# Patient Record
Sex: Female | Born: 1955 | Race: White | Hispanic: No | Marital: Married
Health system: Southern US, Community
[De-identification: ages and names within clinical notes are randomized; demographics above are authoritative.]

---

## 2005-04-07 ENCOUNTER — Ambulatory Visit (HOSPITAL_COMMUNITY): Admission: RE | Admit: 2005-04-07 | Discharge: 2005-04-07 | Payer: Self-pay | Admitting: Certified Nurse Midwife

## 2005-04-15 ENCOUNTER — Ambulatory Visit: Payer: Self-pay | Admitting: Internal Medicine

## 2005-04-21 LAB — COMPREHENSIVE METABOLIC PANEL
AST: 15 U/L (ref 0–37)
Albumin: 4.1 g/dL (ref 3.5–5.2)
Alkaline Phosphatase: 50 U/L (ref 39–117)
BUN: 9 mg/dL (ref 6–23)
Calcium: 8.7 mg/dL (ref 8.4–10.5)
Total Bilirubin: 0.3 mg/dL (ref 0.3–1.2)
Total Protein: 7 g/dL (ref 6.0–8.3)

## 2005-04-21 LAB — CBC WITH DIFFERENTIAL/PLATELET
Basophils Absolute: 0 10*3/uL (ref 0.0–0.1)
Eosinophils Absolute: 0 10*3/uL (ref 0.0–0.5)
LYMPH%: 28.9 % (ref 14.0–48.0)
MCHC: 31.2 g/dL — ABNORMAL LOW (ref 32.0–36.0)
MCV: 74.7 fL — ABNORMAL LOW (ref 81.0–101.0)
MONO%: 9.4 % (ref 0.0–13.0)
NEUT#: 2.4 10*3/uL (ref 1.5–6.5)
Platelets: 246 10*3/uL (ref 145–400)
RDW: 31.5 % — ABNORMAL HIGH (ref 11.3–14.5)
WBC: 4.1 10*3/uL (ref 3.9–10.0)

## 2005-04-22 LAB — PROTEIN ELECTROPHORESIS, SERUM
Alpha-1-Globulin: 4.9 % (ref 2.9–4.9)
Alpha-2-Globulin: 10.2 % (ref 7.1–11.8)
Beta 2: 4.7 % (ref 3.2–6.5)
Beta Globulin: 7.4 % — ABNORMAL HIGH (ref 4.7–7.2)
Gamma Globulin: 17.4 % (ref 11.1–18.8)

## 2005-04-22 LAB — FERRITIN: Ferritin: 20 ng/mL (ref 10–291)

## 2005-04-22 LAB — LACTATE DEHYDROGENASE: LDH: 149 U/L (ref 94–250)

## 2005-04-22 LAB — IRON AND TIBC: TIBC: 410 ug/dL (ref 250–470)

## 2005-06-01 ENCOUNTER — Ambulatory Visit (HOSPITAL_COMMUNITY): Admission: RE | Admit: 2005-06-01 | Discharge: 2005-06-02 | Payer: Self-pay | Admitting: Obstetrics and Gynecology

## 2005-06-01 ENCOUNTER — Encounter (INDEPENDENT_AMBULATORY_CARE_PROVIDER_SITE_OTHER): Payer: Self-pay | Admitting: *Deleted

## 2005-06-07 ENCOUNTER — Ambulatory Visit: Payer: Self-pay | Admitting: Internal Medicine

## 2006-10-10 ENCOUNTER — Ambulatory Visit (HOSPITAL_COMMUNITY): Admission: RE | Admit: 2006-10-10 | Discharge: 2006-10-10 | Payer: Self-pay | Admitting: Obstetrics and Gynecology

## 2007-07-08 IMAGING — CR DG CHEST 2V
2 series · 2 of 2 positions shown · non-contrast
Comparison: None.

CLINICAL DATA: Bronchitis.
 CHEST ? 2 VIEW:

[view not recorded (1 of 2)]
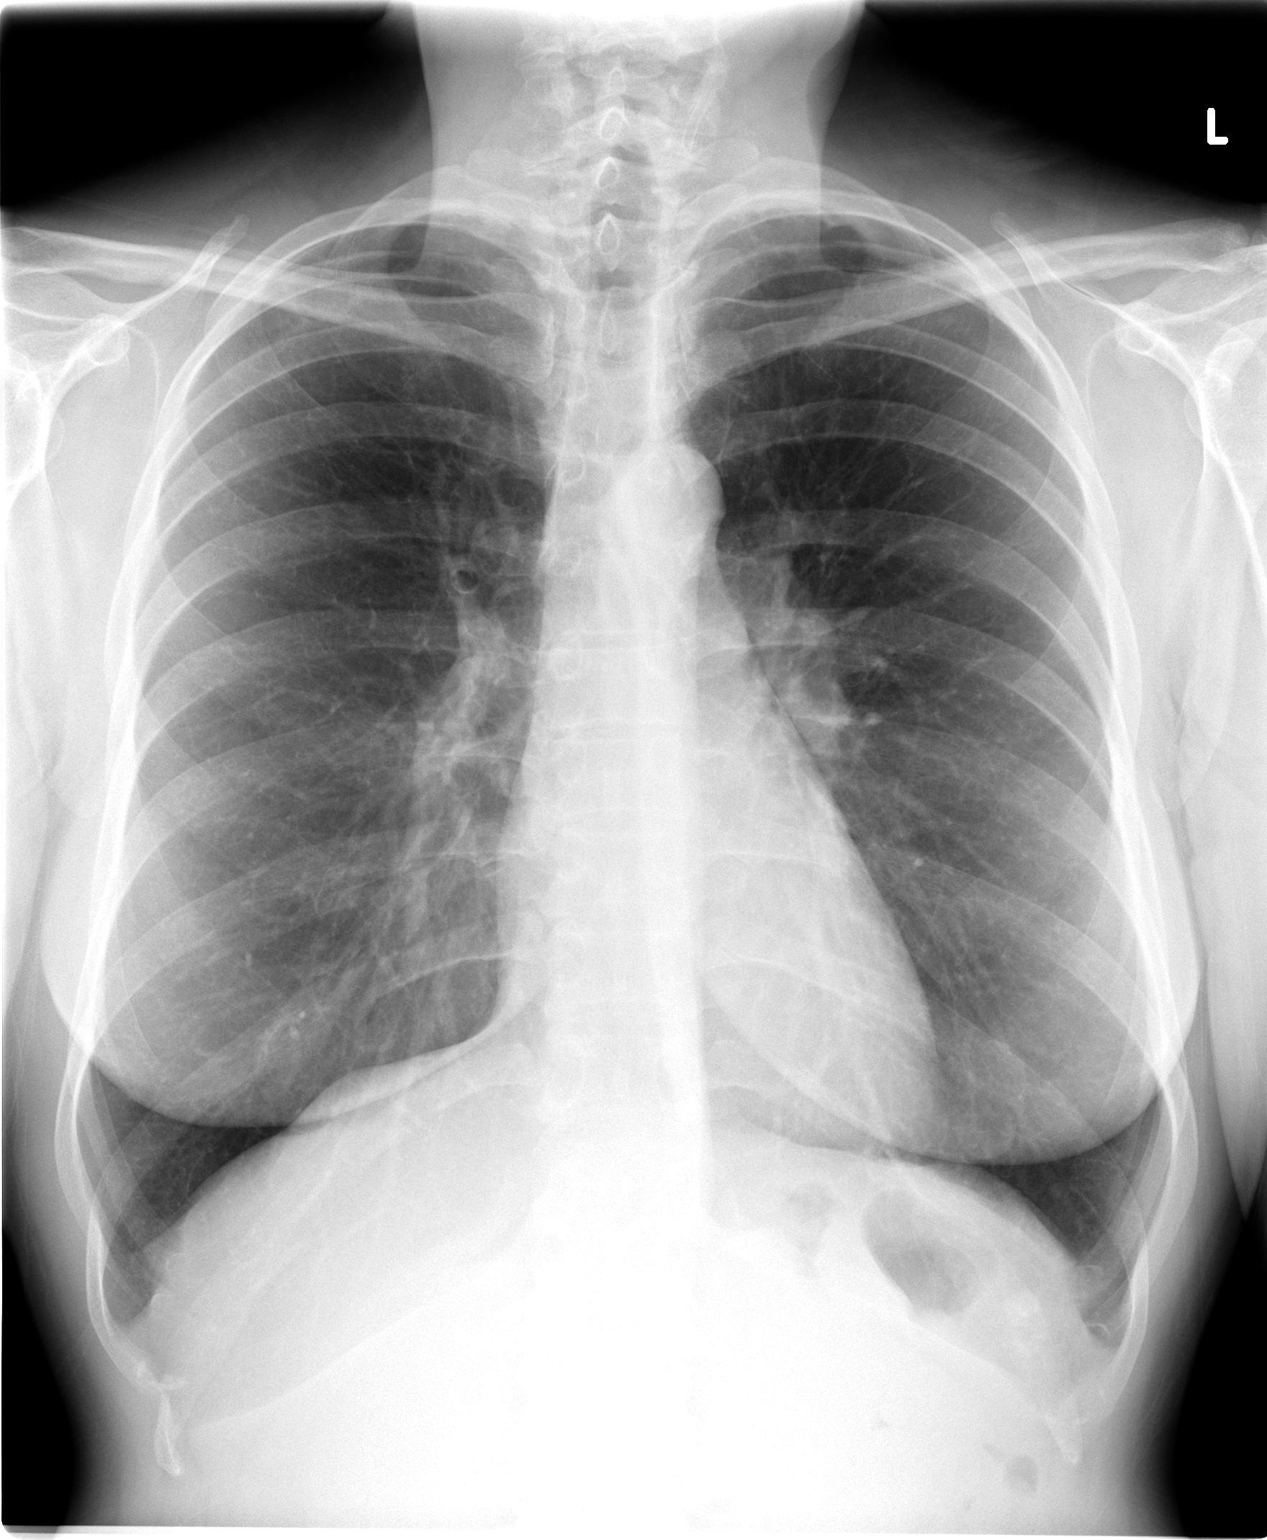

[view not recorded (2 of 2)]
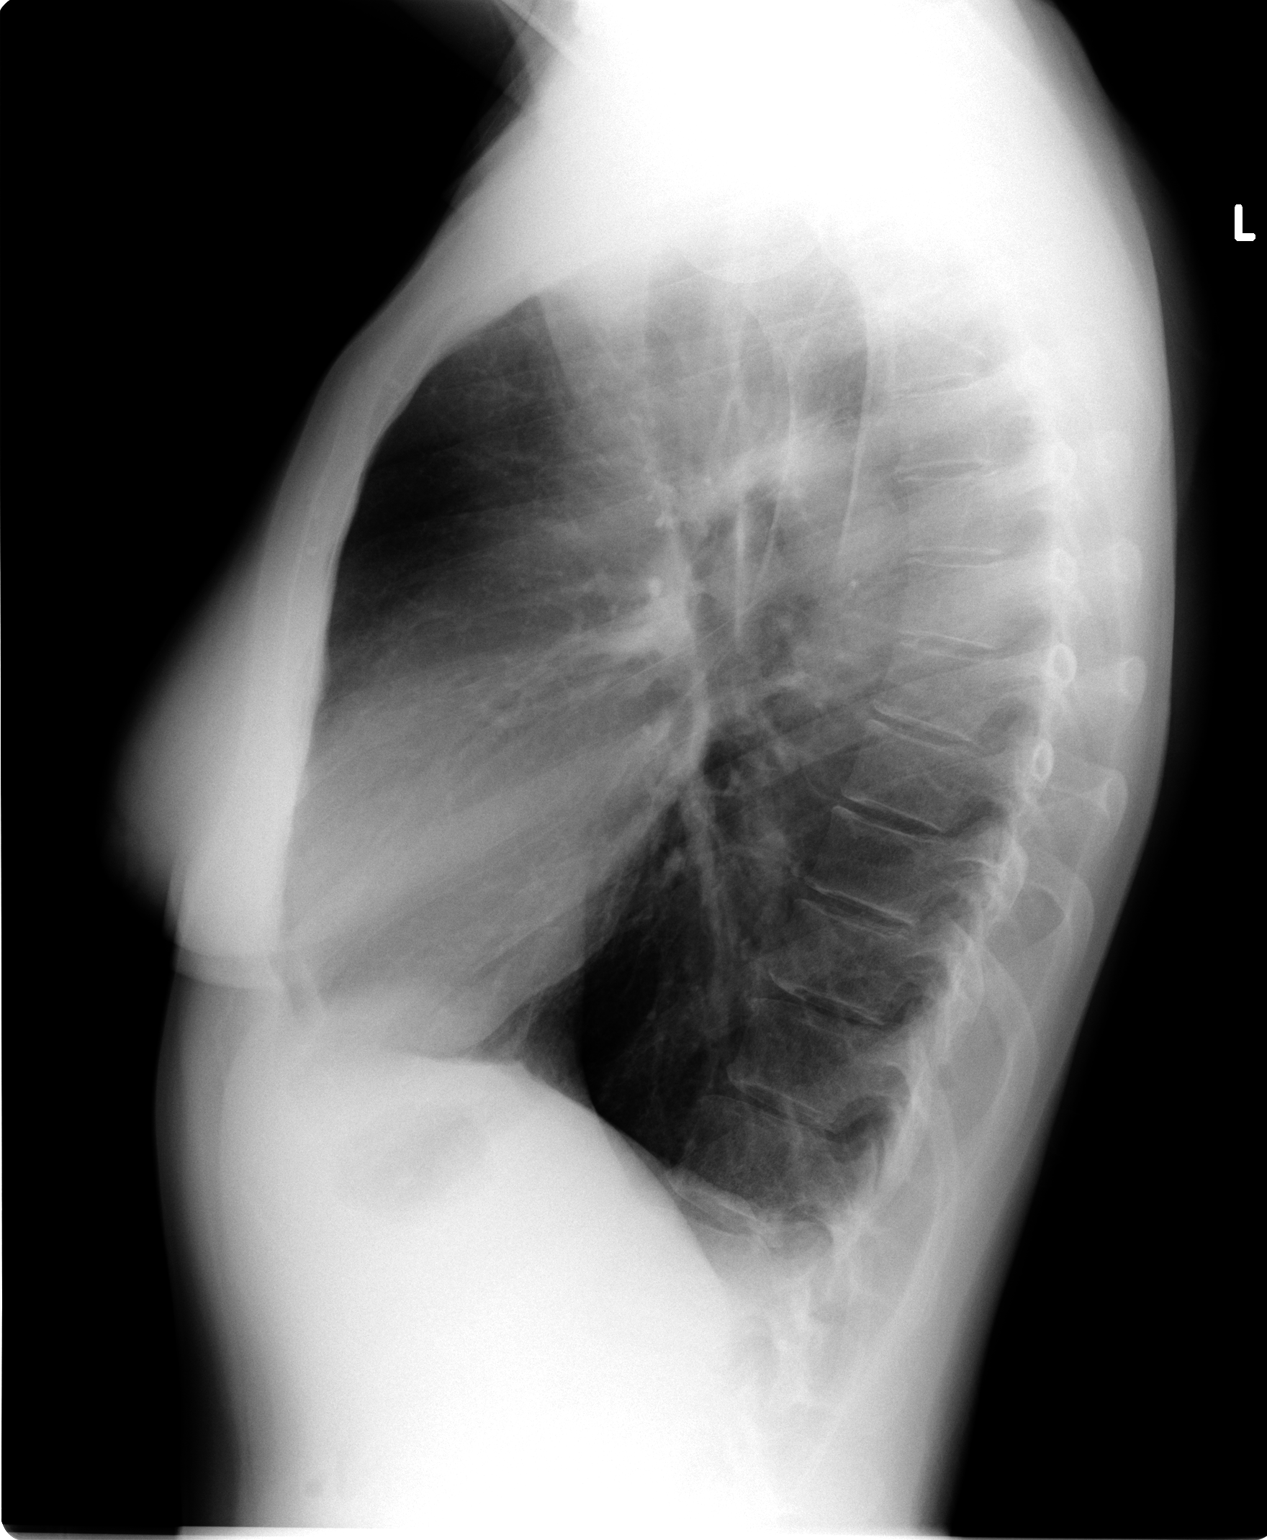

[2 of 2 positions shown; findings below may reference images not displayed]

FINDINGS: Bilateral hyperexpansion without focal consolidation, edema, or pleural effusion.  The cardio/pericardial silhouette is within normal limits for size.  Interstitial markings are diffusely coarsened with chronic features.  Bony structures of the visualized thorax are intact.
IMPRESSION: Hyperinflation with mild chronic interstitial thickening.  No acute findings.

## 2007-10-27 ENCOUNTER — Ambulatory Visit (HOSPITAL_COMMUNITY): Admission: RE | Admit: 2007-10-27 | Discharge: 2007-10-27 | Payer: Self-pay | Admitting: Obstetrics and Gynecology

## 2009-06-03 ENCOUNTER — Emergency Department: Payer: Self-pay | Admitting: Emergency Medicine

## 2009-12-09 ENCOUNTER — Emergency Department: Payer: Self-pay | Admitting: Emergency Medicine

## 2009-12-12 ENCOUNTER — Ambulatory Visit: Payer: Self-pay | Admitting: Unknown Physician Specialty

## 2010-01-23 ENCOUNTER — Encounter: Payer: Self-pay | Admitting: General Practice

## 2010-01-28 ENCOUNTER — Ambulatory Visit (HOSPITAL_COMMUNITY)
Admission: RE | Admit: 2010-01-28 | Discharge: 2010-01-28 | Payer: Self-pay | Source: Home / Self Care | Attending: Family Medicine | Admitting: Family Medicine

## 2010-02-18 ENCOUNTER — Encounter: Payer: Self-pay | Admitting: General Practice

## 2010-03-19 ENCOUNTER — Encounter: Payer: Self-pay | Admitting: General Practice

## 2010-06-05 NOTE — Op Note (Signed)
Angela Turner, Angela Turner                 ACCOUNT NO.:  [id number]   MEDICAL RECORD NO.:  [id number]          PATIENT TYPE:  OIB   LOCATION:  9311                          FACILITY:  [facility]   PHYSICIAN:  Maxie Better, M.D.DATE OF BIRTH:  [birth year]   DATE OF PROCEDURE:  06/01/2005  DATE OF DISCHARGE:                                 OPERATIVE REPORT   PREOPERATIVE DIAGNOSES:  1.  Menorrhagia.  2.  History of severe iron deficiency anemia.   PROCEDURE:  Total vaginal hysterectomy, bilateral salpingo-oophorectomy.   POSTOPERATIVE DIAGNOSES:  1.  Menorrhagia.  2.  History of severe deficiency anemia.   ANESTHESIA:  General.   SURGEON:  Maxie Better, M.D.   ASSISTANT:  Genia Del, M.D.   </DESCRIPTION OF PROCEDURE/Under adequate general anesthesia the patient is  placed in the dorsal lithotomy position.  She was sterilely prepped and  draped in the usual fashion.  An indwelling Foley catheter was sterilely  placed.  Examination under anesthesia revealed a  retroverted uterus.  No  adnexal masses could be appreciated.  A weighted speculum was placed in the  vagina.  Sims retractor was used anteriorly.  The cervix was parous.  The  anterior posterior lip of the cervix was grasped with a Jacobson clamp.  A  dilute solution of Pitressin was injected  circumferentially at the  cervicovaginal junction.  A circumferential incision was then made at that  junction and then undermined with Mayo scissors.  The posterior cul-de-sac  was subsequently opened without incident.  The vaginal cuff was then over  sewn with 0 Vicryl running lock stitch for hemostasis.  A longer weighted  speculum was then placed.  Attention was then turned to the anterior cul-de-  sac.  Several attempts at opening the anterior cul-de-sac was made but was  unsuccessful at which point a decision was then made to bilaterally clamped  the uterosacral ligaments, cut and suture ligated with 0 Vicryl.  The  cardinal ligaments were then serially clamped and cut and using a LigaSure  for cauterization.  The anterior cul-de-sac was then reexplored including  using a finger to identify the bladder reflection anteriorly by approaching  via posteriorly to the cervix.  The anterior cul-de-sac was opened  transversely and the uterine vessels were then bilaterally clamped,  cauterized and cut.  This was carried until the utero-ovarian ligament was  reached.  At that point, all instruments of the cervix was removed.  Decision was then made to evert the uterus to better expose the tube and  ovaries bilaterally.  This was done after the fundus was grasped with a  single-tooth tenaculum.  Both tubes and ovaries were noted to be normal.  The ovaries were small.  With careful sidewall retraction and direct  visualization, serial clamping and cauterization and cut of the  infundibulopelvic ligament was done bilaterally.  This resulted in the  removal of the uterus with tubes and ovary attached bilaterally.  To that  point, the side walls were inspected.  The gray areas of the pedicles  where  all areas were noted laterally and  this was carefully cauterized and then  suture ligated with hemostasis.  When good hemostasis finally noted, McCall  culdoplasty suture was then placed with 0 Vicryl and extended from one  uterosacral to the next  then tied in the midline,  The vaginal cuff was  then closed with interrupted 0 Vicryl figure-of-eight sutures in a vertical  fashion.  The vagina was then copiously irrigated and packed with 2-inch  plain gauze soaked in Ancef testing.  The specimen labeled uterus with  cervix, tubes and ovaries bilaterally were sent to pathology.  Weight of the  specimen intraoperatively is 212 grams.  Intraoperative fluid was 20 mL.  Urine output was 150 mL clear yellow urine.  Sponge and instrument counts x2  was correct.  Estimated blood loss was less than 100 mL.  Complication was  none.   The patient tolerated the procedure well and was transferred to  recovery in stable condition.      Maxie Better, M.D.  Electronically Signed     Girard/MEDQ  D:  06/01/2005  T:  06/02/2005  Job:  [id number]

## 2010-11-19 ENCOUNTER — Other Ambulatory Visit: Payer: Self-pay | Admitting: Neurology

## 2010-12-17 ENCOUNTER — Ambulatory Visit: Payer: Self-pay | Admitting: Gastroenterology

## 2010-12-24 ENCOUNTER — Other Ambulatory Visit (HOSPITAL_COMMUNITY): Payer: Self-pay | Admitting: Family Medicine

## 2010-12-24 DIAGNOSIS — Z1231 Encounter for screening mammogram for malignant neoplasm of breast: Secondary | ICD-10-CM

## 2011-02-01 ENCOUNTER — Ambulatory Visit (HOSPITAL_COMMUNITY)
Admission: RE | Admit: 2011-02-01 | Discharge: 2011-02-01 | Disposition: A | Discharge status: Home / Self Care | Payer: BC Managed Care – PPO | Source: Ambulatory Visit | Attending: Family Medicine | Admitting: Family Medicine

## 2011-02-01 DIAGNOSIS — Z1231 Encounter for screening mammogram for malignant neoplasm of breast: Secondary | ICD-10-CM | POA: Insufficient documentation

## 2011-02-02 ENCOUNTER — Other Ambulatory Visit: Payer: Self-pay | Admitting: Family Medicine

## 2011-02-02 DIAGNOSIS — R928 Other abnormal and inconclusive findings on diagnostic imaging of breast: Secondary | ICD-10-CM

## 2011-02-09 ENCOUNTER — Ambulatory Visit
Admission: RE | Admit: 2011-02-09 | Discharge: 2011-02-09 | Disposition: A | Discharge status: Home / Self Care | Payer: BC Managed Care – PPO | Source: Ambulatory Visit | Attending: Family Medicine | Admitting: Family Medicine

## 2011-02-09 DIAGNOSIS — R928 Other abnormal and inconclusive findings on diagnostic imaging of breast: Secondary | ICD-10-CM

## 2011-08-30 ENCOUNTER — Other Ambulatory Visit: Payer: Self-pay | Admitting: Family Medicine

## 2011-08-30 DIAGNOSIS — N6459 Other signs and symptoms in breast: Secondary | ICD-10-CM

## 2011-09-08 ENCOUNTER — Ambulatory Visit
Admission: RE | Admit: 2011-09-08 | Discharge: 2011-09-08 | Disposition: A | Discharge status: Home / Self Care | Payer: BC Managed Care – PPO | Source: Ambulatory Visit | Attending: Family Medicine | Admitting: Family Medicine

## 2011-09-08 DIAGNOSIS — N6459 Other signs and symptoms in breast: Secondary | ICD-10-CM

## 2012-01-17 ENCOUNTER — Other Ambulatory Visit: Payer: Self-pay | Admitting: Family Medicine

## 2012-01-17 DIAGNOSIS — R599 Enlarged lymph nodes, unspecified: Secondary | ICD-10-CM

## 2012-02-02 ENCOUNTER — Other Ambulatory Visit: Payer: BC Managed Care – PPO

## 2012-03-10 IMAGING — CR DG WRIST COMPLETE 3+V*R*
1 series · 4 of 4 positions shown · non-contrast
Comparison: none

REASON FOR EXAM: tendon
COMMENTS:

[Series 1: view not recorded · 0.17mm/px · 4 of 4 slices shown]
[im 1/4]
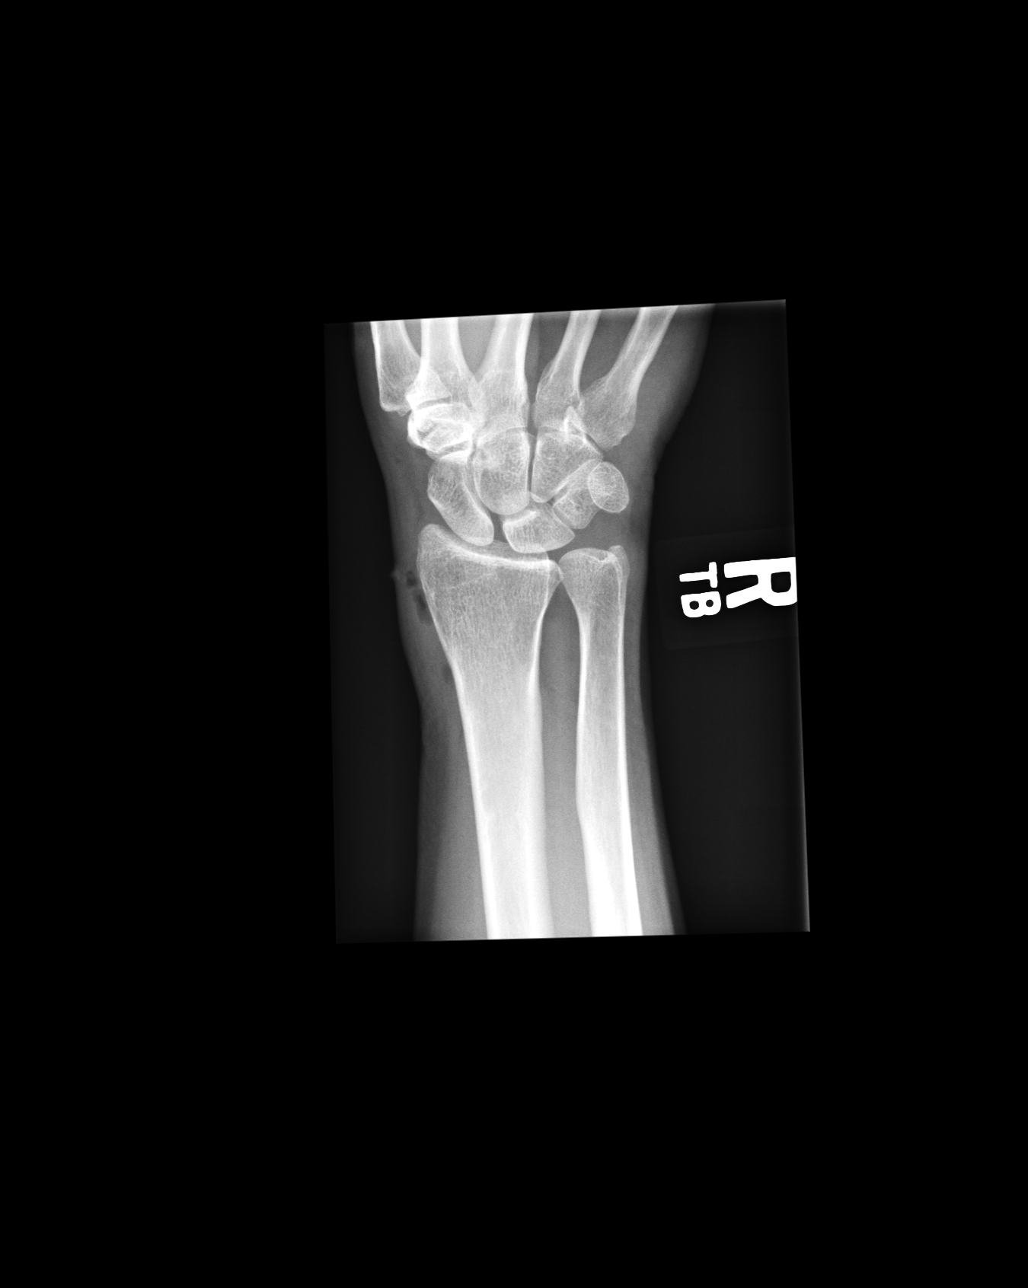
[im 2/4]
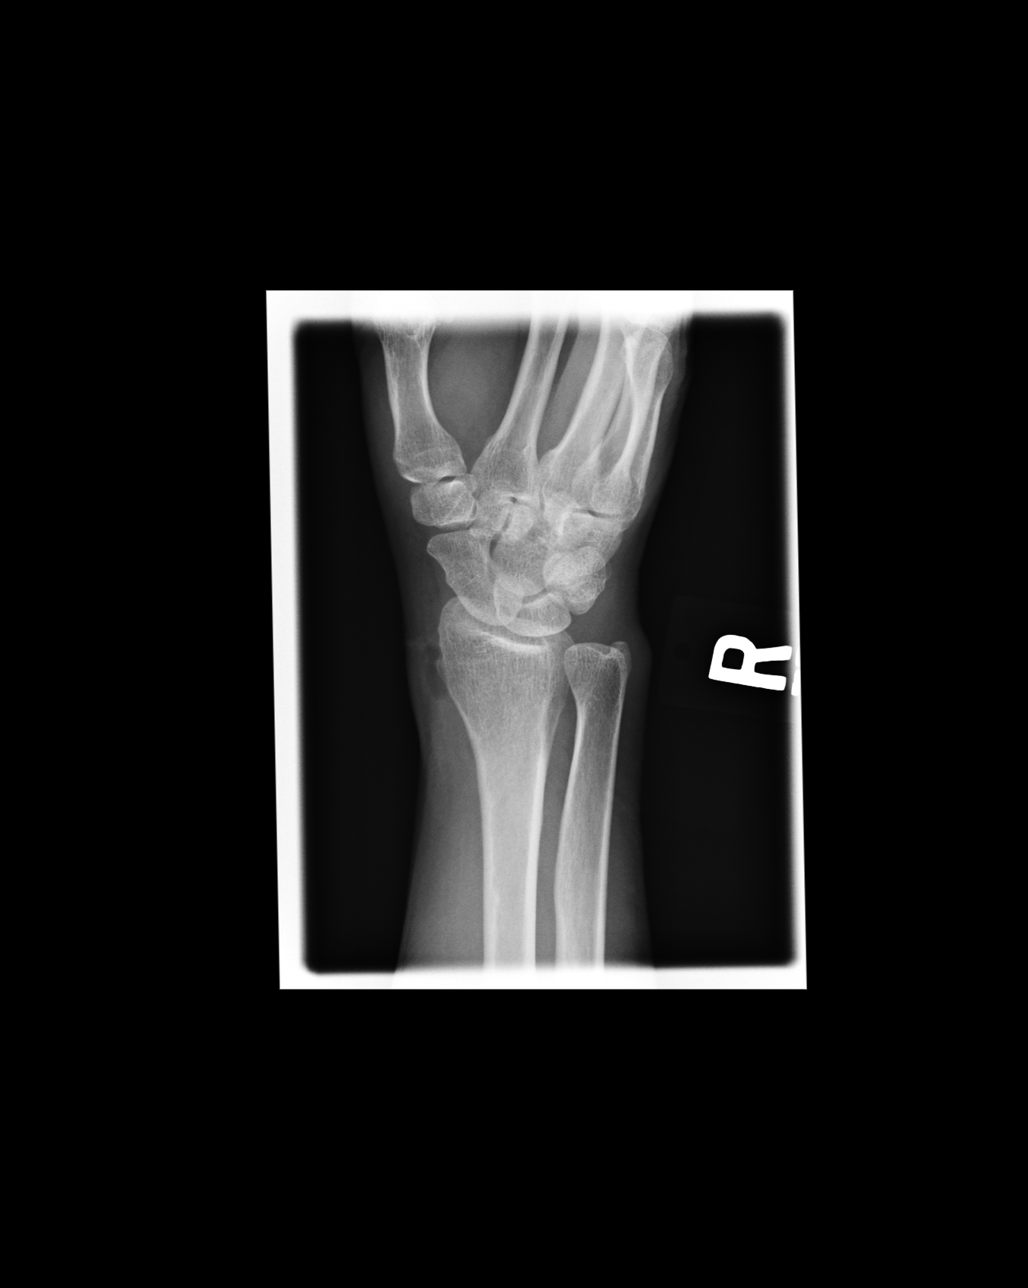
[im 3/4]
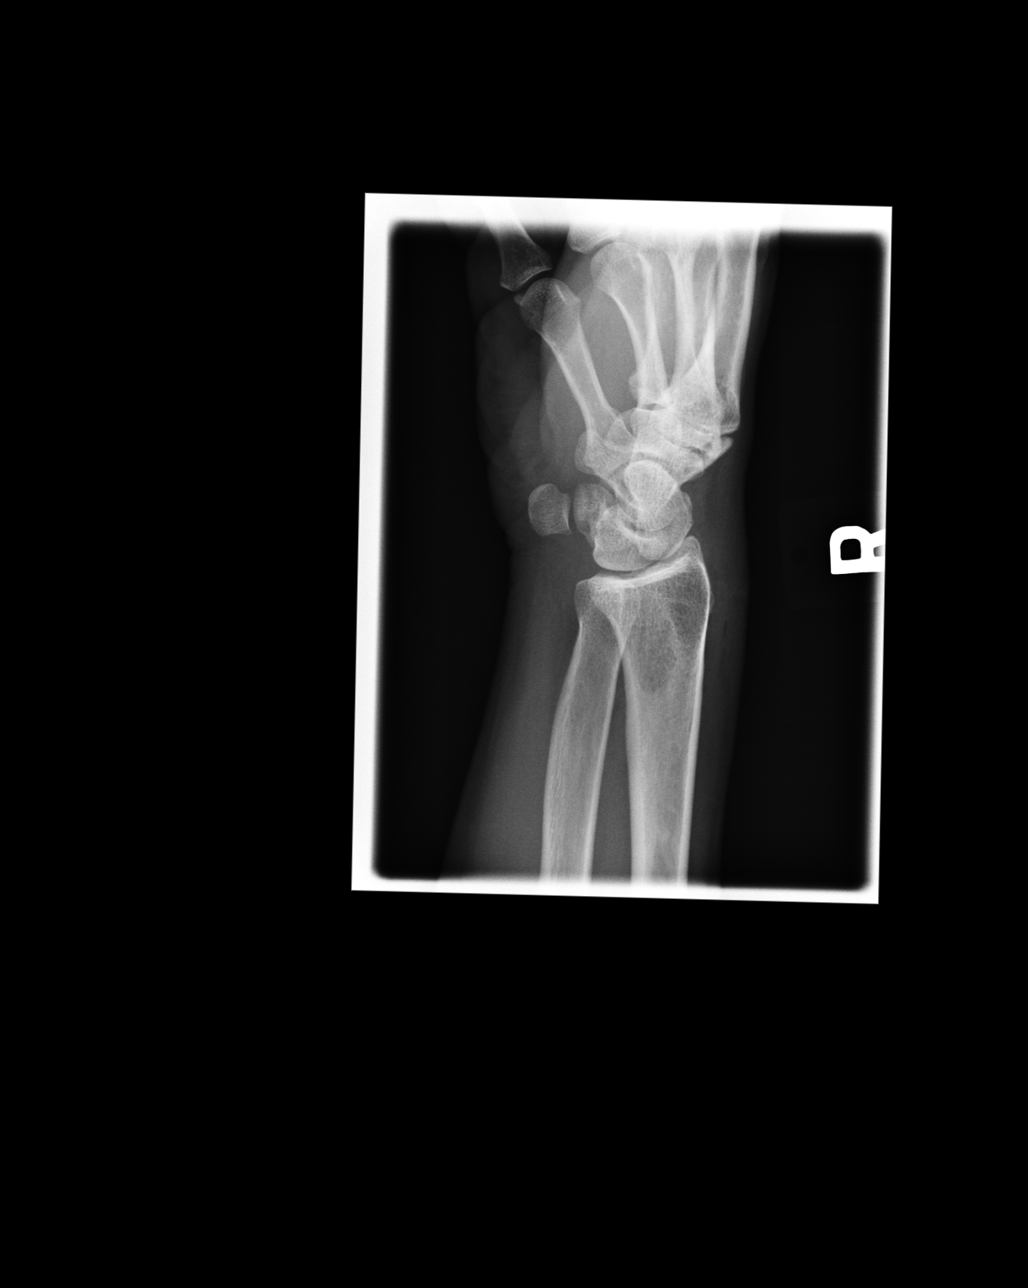
[im 4/4]
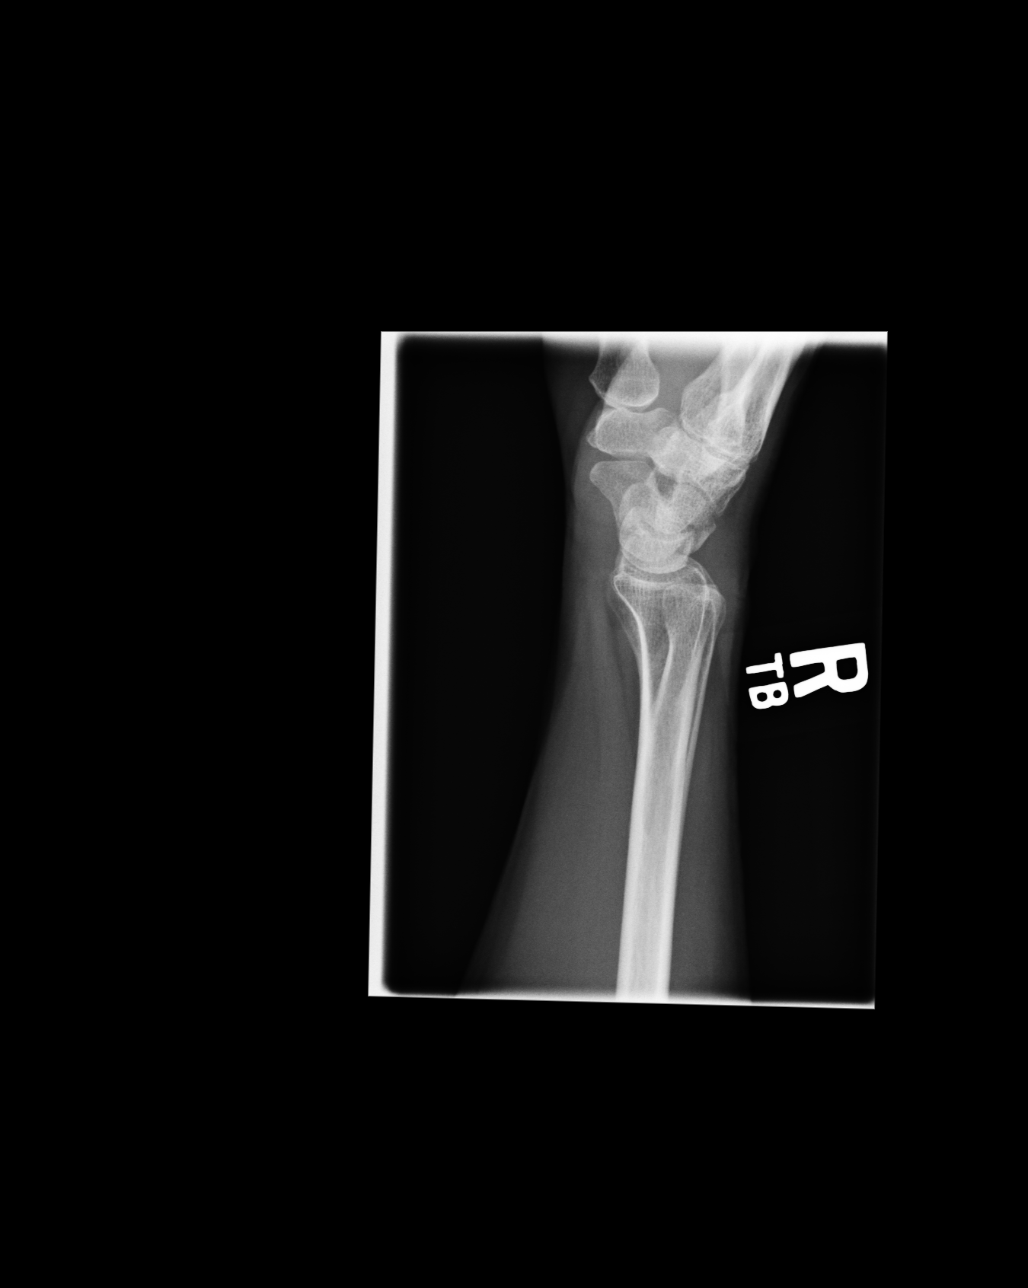

[4 of 4 positions shown; findings below may reference images not displayed]

PROCEDURE:     DXR - DXR WRIST RT COMP WITH OBLIQUES  - December 09, 2009  [DATE]

RESULT:     No fracture, dislocation or other acute bony abnormality is
seen. There is observed gas in the soft tissues compatible with a history of
recent laceration injury. No radiodense soft tissue foreign body is seen.
IMPRESSION: 1. No acute bony abnormalities are seen.
2. No soft tissue foreign body is identified.
3. There is noted gas in the soft tissues lateral to the distal radius.

## 2012-03-13 ENCOUNTER — Other Ambulatory Visit: Payer: BC Managed Care – PPO

## 2012-03-22 ENCOUNTER — Ambulatory Visit
Admission: RE | Admit: 2012-03-22 | Discharge: 2012-03-22 | Disposition: A | Discharge status: Home / Self Care | Payer: BC Managed Care – PPO | Source: Ambulatory Visit | Attending: Family Medicine | Admitting: Family Medicine

## 2012-03-22 ENCOUNTER — Other Ambulatory Visit: Payer: Self-pay | Admitting: Family Medicine

## 2012-03-22 DIAGNOSIS — R599 Enlarged lymph nodes, unspecified: Secondary | ICD-10-CM

## 2012-05-22 ENCOUNTER — Ambulatory Visit (INDEPENDENT_AMBULATORY_CARE_PROVIDER_SITE_OTHER): Payer: BC Managed Care – PPO | Admitting: Psychology

## 2012-05-22 DIAGNOSIS — F331 Major depressive disorder, recurrent, moderate: Secondary | ICD-10-CM

## 2013-03-28 ENCOUNTER — Other Ambulatory Visit: Payer: Self-pay

## 2013-03-28 DIAGNOSIS — Z1231 Encounter for screening mammogram for malignant neoplasm of breast: Secondary | ICD-10-CM

## 2013-04-17 ENCOUNTER — Ambulatory Visit
Admission: RE | Admit: 2013-04-17 | Discharge: 2013-04-17 | Disposition: A | Discharge status: Home / Self Care | Payer: 59 | Source: Ambulatory Visit

## 2013-04-17 DIAGNOSIS — Z1231 Encounter for screening mammogram for malignant neoplasm of breast: Secondary | ICD-10-CM

## 2014-06-04 ENCOUNTER — Other Ambulatory Visit: Payer: Self-pay

## 2014-06-04 DIAGNOSIS — Z1231 Encounter for screening mammogram for malignant neoplasm of breast: Secondary | ICD-10-CM

## 2014-06-28 ENCOUNTER — Ambulatory Visit
Admission: RE | Admit: 2014-06-28 | Discharge: 2014-06-28 | Disposition: A | Discharge status: Home / Self Care | Payer: BLUE CROSS/BLUE SHIELD | Source: Ambulatory Visit

## 2014-06-28 DIAGNOSIS — Z1231 Encounter for screening mammogram for malignant neoplasm of breast: Secondary | ICD-10-CM

## 2015-07-17 ENCOUNTER — Other Ambulatory Visit: Payer: Self-pay | Admitting: Family Medicine

## 2015-07-17 DIAGNOSIS — Z1231 Encounter for screening mammogram for malignant neoplasm of breast: Secondary | ICD-10-CM

## 2015-07-24 ENCOUNTER — Ambulatory Visit: Payer: BLUE CROSS/BLUE SHIELD

## 2015-08-28 ENCOUNTER — Ambulatory Visit
Admission: RE | Admit: 2015-08-28 | Discharge: 2015-08-28 | Disposition: A | Discharge status: Home / Self Care | Payer: BLUE CROSS/BLUE SHIELD | Source: Ambulatory Visit | Attending: Family Medicine | Admitting: Family Medicine

## 2015-08-28 DIAGNOSIS — Z1231 Encounter for screening mammogram for malignant neoplasm of breast: Secondary | ICD-10-CM

## 2016-07-05 DIAGNOSIS — Z13220 Encounter for screening for lipoid disorders: Secondary | ICD-10-CM | POA: Diagnosis not present

## 2016-07-05 DIAGNOSIS — Z131 Encounter for screening for diabetes mellitus: Secondary | ICD-10-CM | POA: Diagnosis not present

## 2016-07-05 DIAGNOSIS — Z0000 Encounter for general adult medical examination without abnormal findings: Secondary | ICD-10-CM | POA: Diagnosis not present

## 2016-07-08 ENCOUNTER — Telehealth: Payer: Self-pay | Admitting: Acute Care

## 2016-07-12 NOTE — Telephone Encounter (Signed)
Spoke with Dianna at Dr Jone BasemanGriffin's office and explained that I have left a message for pt to call me back regarding SDMV and CT.  I informed Dianna that [employer] does not cover SDMV and CT but I will offer the out of pocket prices to pt.  She verbalized understanding.  Will close this message and refer to referral notes.

## 2016-07-14 DIAGNOSIS — K641 Second degree hemorrhoids: Secondary | ICD-10-CM | POA: Diagnosis not present

## 2016-07-14 DIAGNOSIS — Z1211 Encounter for screening for malignant neoplasm of colon: Secondary | ICD-10-CM | POA: Diagnosis not present
